# Patient Record
Sex: Female | Born: 2000 | Race: White | Hispanic: No | Marital: Single
Health system: Southern US, Community
[De-identification: ages and names within clinical notes are randomized; demographics above are authoritative.]

---

## 2019-08-07 ENCOUNTER — Other Ambulatory Visit: Payer: Self-pay

## 2019-08-07 DIAGNOSIS — Z20822 Contact with and (suspected) exposure to covid-19: Secondary | ICD-10-CM

## 2019-08-09 LAB — NOVEL CORONAVIRUS, NAA: SARS-CoV-2, NAA: NOT DETECTED

## 2020-02-05 ENCOUNTER — Ambulatory Visit: Payer: Self-pay | Attending: Internal Medicine

## 2020-02-05 DIAGNOSIS — Z23 Encounter for immunization: Secondary | ICD-10-CM

## 2020-02-05 NOTE — Progress Notes (Signed)
   Covid-[facility] Vaccination [facility]  Name:  [name]    MRN: [id number] DOB: Sep 27, 2001  02/05/2020  [name] was observed post Covid-19 immunization for 15 minutes without incident. She was provided with Vaccine Information Sheet and instruction to access the V-Safe system.   [name] was instructed to call 911 with any severe reactions post vaccine: Marland Kitchen Difficulty breathing  . Swelling of face and throat  . A fast heartbeat  . A bad rash all over body  . Dizziness and weakness   Immunizations Administered    Name Date Dose VIS Date Route   Pfizer COVID-19 Vaccine 02/05/2020  9:19 AM 0.3 mL 10/27/2019 Intramuscular   Manufacturer: [employer], [employer]   Lot: [id number]   NDC: [address]-8

## 2020-02-26 ENCOUNTER — Ambulatory Visit: Payer: Self-pay | Attending: Internal Medicine

## 2020-02-26 DIAGNOSIS — Z23 Encounter for immunization: Secondary | ICD-10-CM

## 2020-02-26 NOTE — Progress Notes (Signed)
   Covid-19 Vaccination Clinic  Name:  Vanessa Foley    MRN: [id number] DOB: May 04, 2001  02/26/2020  [name] was observed post Covid-19 immunization for 15 minutes without incident. She was provided with Vaccine Information Sheet and instruction to access the V-Safe system.   [name] was instructed to call 911 with any severe reactions post vaccine: Marland Kitchen Difficulty breathing  . Swelling of face and throat  . A fast heartbeat  . A bad rash all over body  . Dizziness and weakness   Immunizations Administered    Name Date Dose VIS Date Route   Pfizer COVID-19 Vaccine 02/26/2020  9:06 AM 0.3 mL 10/27/2019 Intramuscular   Manufacturer: [employer], [employer]   Lot: [id number]   NDC: [address]-8

## 2021-03-18 ENCOUNTER — Other Ambulatory Visit: Payer: Self-pay

## 2021-03-18 ENCOUNTER — Other Ambulatory Visit: Payer: Self-pay | Admitting: Family Medicine

## 2021-03-18 ENCOUNTER — Ambulatory Visit
Admission: RE | Admit: 2021-03-18 | Discharge: 2021-03-18 | Disposition: A | Discharge status: Home / Self Care | Payer: Self-pay | Attending: Family Medicine | Admitting: Family Medicine

## 2021-03-18 ENCOUNTER — Ambulatory Visit
Admission: RE | Admit: 2021-03-18 | Discharge: 2021-03-18 | Disposition: A | Discharge status: Home / Self Care | Payer: Self-pay | Source: Ambulatory Visit | Attending: Family Medicine | Admitting: Family Medicine

## 2021-03-18 DIAGNOSIS — M545 Low back pain, unspecified: Secondary | ICD-10-CM

## 2021-05-12 IMAGING — CR DG LUMBAR SPINE COMPLETE 4+V
1 series · 5 of 5 positions shown · non-contrast
Comparison: None.

CLINICAL DATA: Pain for 1 week. Left lower back pain radiating to
left calf.

EXAM:
LUMBAR SPINE - COMPLETE 4+ VIEW

[Series 1: dg lumbar spine complete 4 +v · 0.14mm/px · 5 of 5 slices shown]
[im 1/5]
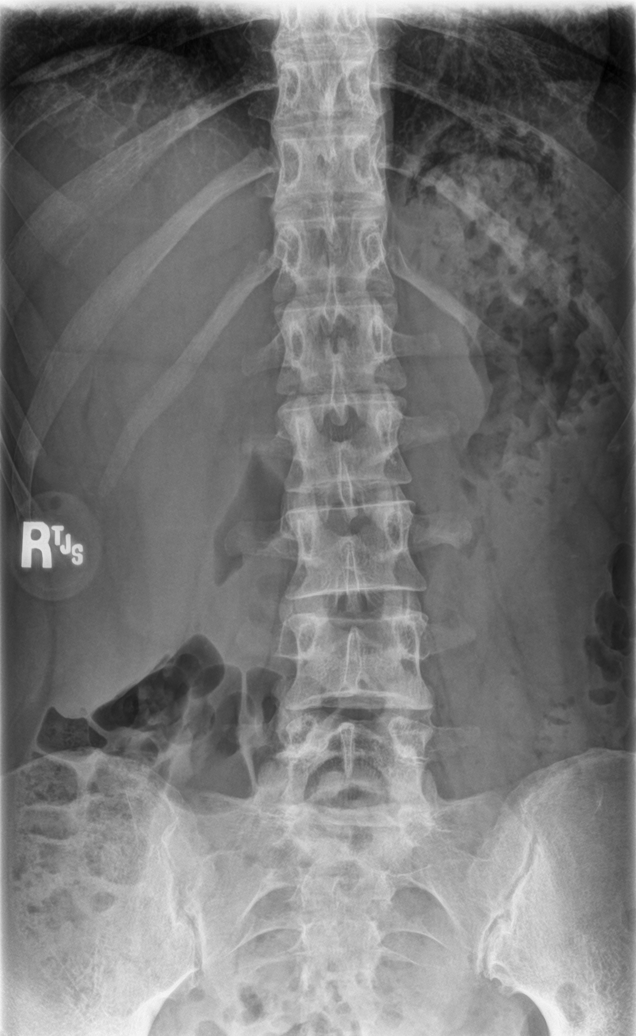
[im 2/5]
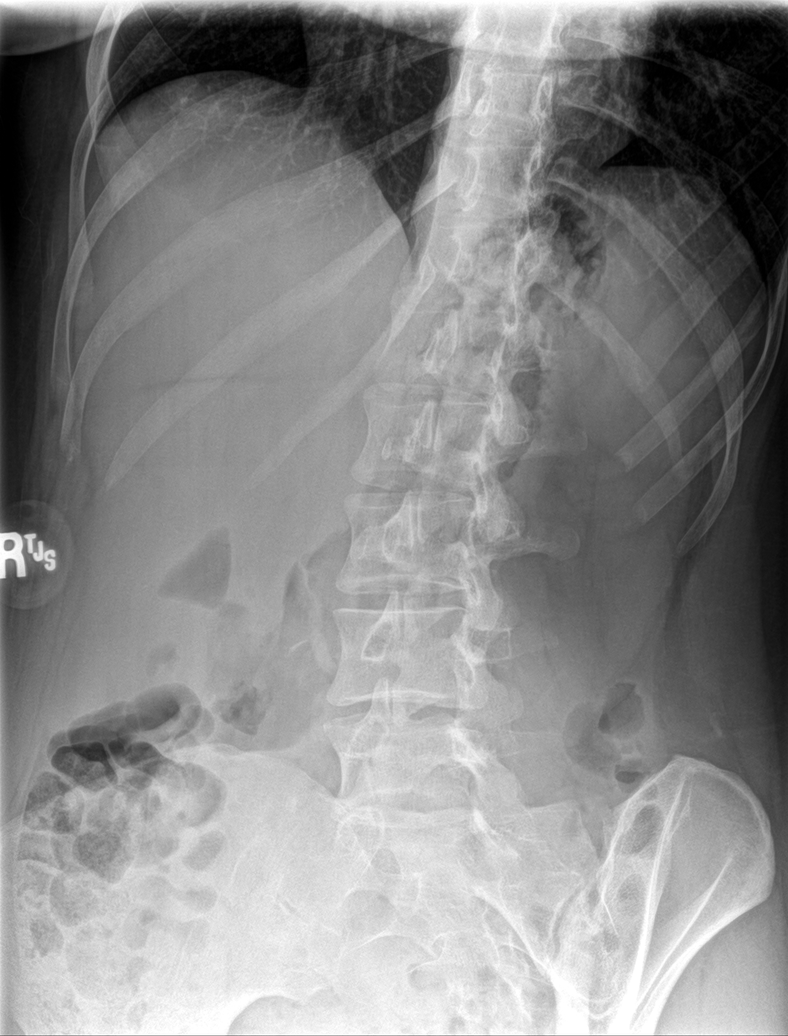
[im 3/5]
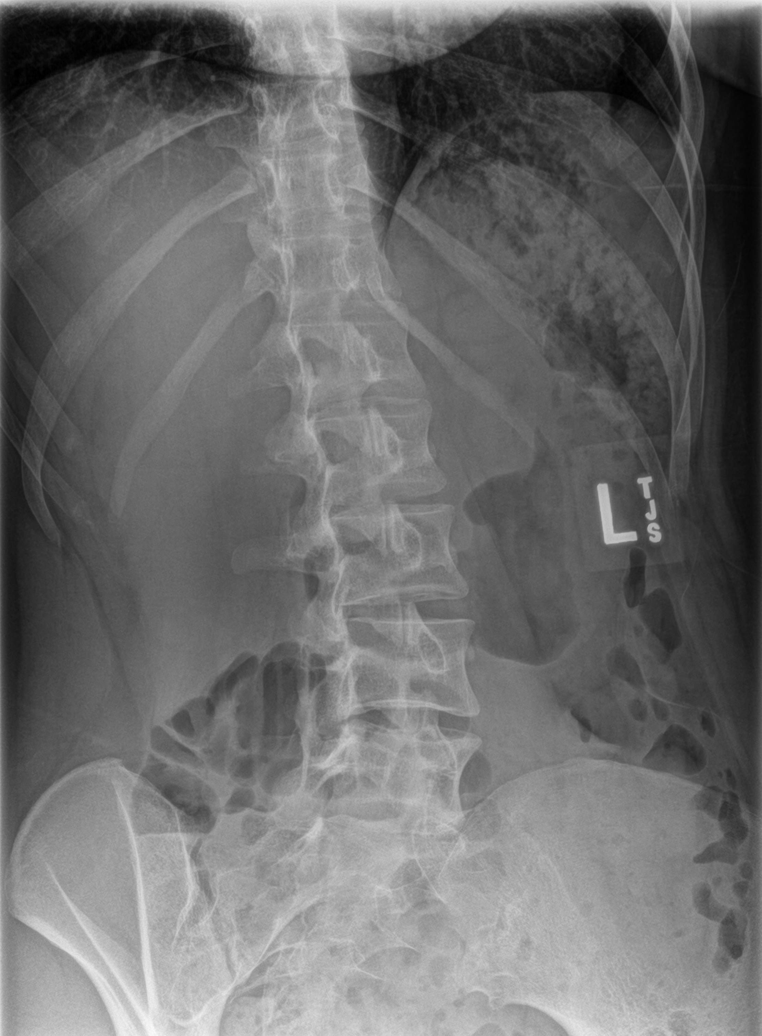
[im 4/5]
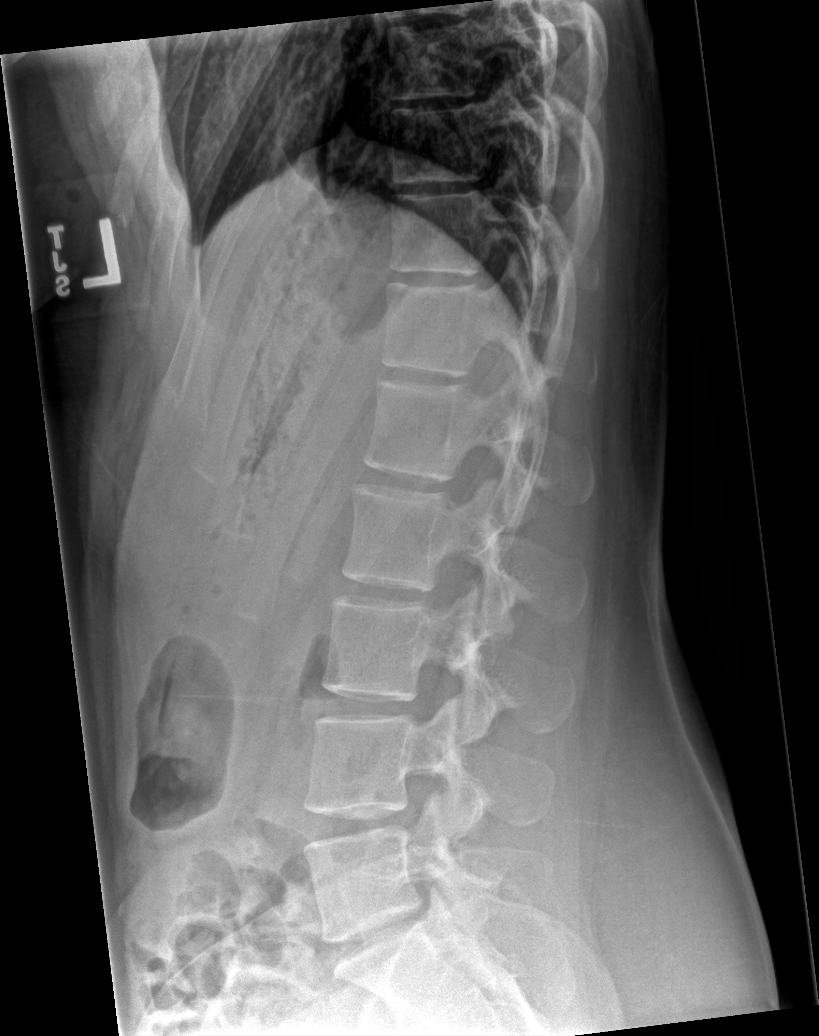
[im 5/5]
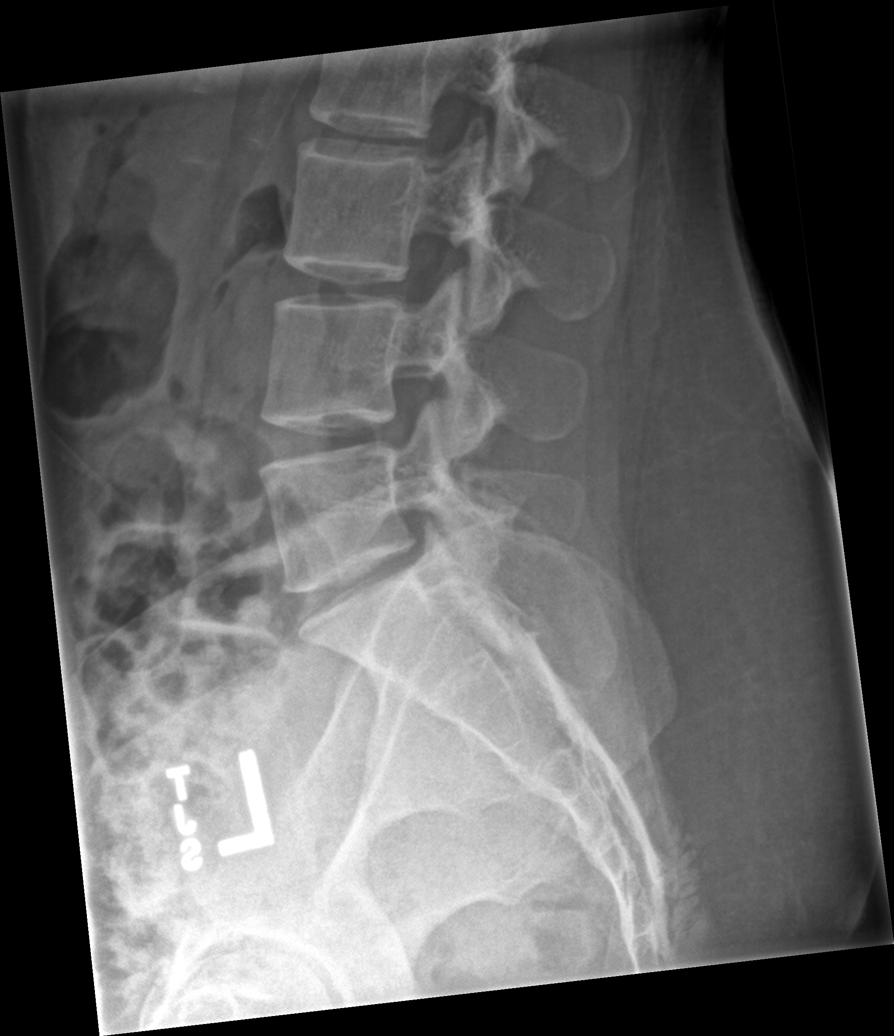

[5 of 5 positions shown; findings below may reference images not displayed]

FINDINGS: The alignment is maintained. Vertebral body heights are normal.
There is no listhesis. No visualized pars defects. The posterior
elements are intact. Disc spaces are preserved. No fracture.
Sacroiliac joints are symmetric and normal.
IMPRESSION: Negative radiographs of the lumbar spine.
